# Patient Record
Sex: Female | Born: 1985
Health system: Southern US, Community
[De-identification: ages and names within clinical notes are randomized; demographics above are authoritative.]

## PROBLEM LIST (undated history)

## (undated) DIAGNOSIS — K219 Gastro-esophageal reflux disease without esophagitis: Secondary | ICD-10-CM

## (undated) HISTORY — PX: WISDOM TOOTH EXTRACTION: SHX21

---

## 2006-01-26 ENCOUNTER — Emergency Department (HOSPITAL_COMMUNITY): Admission: EM | Admit: 2006-01-26 | Discharge: 2006-01-26 | Payer: Self-pay | Admitting: Family Medicine

## 2006-04-26 ENCOUNTER — Emergency Department (HOSPITAL_COMMUNITY): Admission: EM | Admit: 2006-04-26 | Discharge: 2006-04-26 | Payer: Self-pay | Admitting: Family Medicine

## 2009-07-03 ENCOUNTER — Emergency Department (HOSPITAL_COMMUNITY): Admission: EM | Admit: 2009-07-03 | Discharge: 2009-07-03 | Payer: Self-pay | Admitting: Family Medicine

## 2009-09-18 ENCOUNTER — Emergency Department (HOSPITAL_COMMUNITY): Admission: EM | Admit: 2009-09-18 | Discharge: 2009-09-18 | Payer: Self-pay | Admitting: Family Medicine

## 2010-08-27 ENCOUNTER — Emergency Department (HOSPITAL_COMMUNITY): Admission: EM | Admit: 2010-08-27 | Discharge: 2010-08-27 | Payer: Self-pay | Admitting: Family Medicine

## 2010-12-10 ENCOUNTER — Inpatient Hospital Stay (INDEPENDENT_AMBULATORY_CARE_PROVIDER_SITE_OTHER)
Admission: RE | Admit: 2010-12-10 | Discharge: 2010-12-10 | Disposition: A | Discharge status: Home / Self Care | Payer: 59 | Source: Ambulatory Visit | Attending: Emergency Medicine | Admitting: Emergency Medicine

## 2010-12-10 DIAGNOSIS — R110 Nausea: Secondary | ICD-10-CM

## 2010-12-10 DIAGNOSIS — K209 Esophagitis, unspecified without bleeding: Secondary | ICD-10-CM

## 2010-12-10 LAB — POCT URINALYSIS DIPSTICK
Bilirubin Urine: NEGATIVE
Hgb urine dipstick: NEGATIVE
Ketones, ur: 15 mg/dL — AB
Specific Gravity, Urine: 1.025 (ref 1.005–1.030)
pH: 6 (ref 5.0–8.0)

## 2011-01-21 LAB — WET PREP, GENITAL
Trich, Wet Prep: NONE SEEN
Yeast Wet Prep HPF POC: NONE SEEN

## 2011-01-21 LAB — GC/CHLAMYDIA PROBE AMP, GENITAL

## 2011-01-21 LAB — POCT URINALYSIS DIP (DEVICE)
Bilirubin Urine: NEGATIVE
Hgb urine dipstick: NEGATIVE
Ketones, ur: NEGATIVE mg/dL
Specific Gravity, Urine: 1.02 (ref 1.005–1.030)
pH: 5.5 (ref 5.0–8.0)

## 2012-02-01 ENCOUNTER — Ambulatory Visit: Payer: 59 | Admitting: *Deleted

## 2012-02-15 ENCOUNTER — Ambulatory Visit: Payer: 59 | Admitting: *Deleted

## 2012-02-15 ENCOUNTER — Encounter: Payer: Self-pay | Admitting: *Deleted

## 2012-02-15 ENCOUNTER — Encounter: Payer: 59 | Attending: Internal Medicine | Admitting: *Deleted

## 2012-02-15 DIAGNOSIS — Z713 Dietary counseling and surveillance: Secondary | ICD-10-CM | POA: Insufficient documentation

## 2012-02-15 DIAGNOSIS — E663 Overweight: Secondary | ICD-10-CM | POA: Insufficient documentation

## 2012-02-15 NOTE — Progress Notes (Signed)
  Medical Nutrition Therapy:  Appt start time: 1130 end time:  1230.  Assessment:  Primary concerns today: food allergy, rye barley or gluten.  Patient has had allergy testing done and will go for Celiac panel in a few weeks.  Has been experiencing GI distress when eating starches, but isn't sure yet if it's Celiac disease or an allergy  MEDICATIONS: see list     DIETARY INTAKE:  Usual eating pattern includes 2 meals and 1-2 snacks per day.  Everyday foods include fruits and vegetables, lean proteins, almond milk .  Avoided foods include gluten containing foods, rye, and barley.    24-hr recall:  B ( AM): Smoothie with kale or spinach and fresh fruits, almond milk  Snk ( AM): occasionally - fresh fruit or yogurt, water  L ( PM): salad with tilapia or chicken OR hot meal with chicken or fish and vegetables, occasionally pasta (gluten food) Snk ( PM): same as AM D ( PM): Smoothie again Snk ( PM): none Beverages: water, almond milk, thai tea with almond milk  Usual physical activity: last 2 weeks, treadmill for 45 minutes 4x a week    Progress Towards Goal(s):  In progress.   Nutritional Diagnosis:  NB-1.1 Food and nutrition-related knowledge deficit As related to food allergies.  As evidenced by GI distress with starchy foods.    Intervention:  Nutrition counseling provided.  Discussed difference between food allergies and food intolerances.  Discussed gluten-free foods and alternatives to wheat flours.  Encouraged adequate protein intake and discussed adding weight training to exercise routine.  Educated client on how to identify gluten, rye, and barley containing foods via food labels.  Handouts given during visit include:  Gluten-free diet handout  Monitoring/Evaluation:  Dietary intake, exercise, reading food labels, and body weight in 4 week(s).

## 2012-02-15 NOTE — Patient Instructions (Addendum)
Plan: Will add Heather Gregory gluten-free protein powder to morning smoothie Will continue eating 3 smaller balanced meals every day Will add upper body weight training two days a week.  Bicep curls of 5 pound weights and tricep extensions (weight held behind head , keeping elbows close to body) .  8-12 repetitions 3 times of each exercise.  Will continue walking on treadmill 4-5 days a week Will try rice and oatmeal products, such as Kix, rice chex, and Rice Krispies cereals, also oatmeal and oat bran breads.  Checking the labels for gluten containing products (wheat). Use corn or rice flour for at home baking. Will try corn tortillas and taco shells- reading food labels to avoid wheat flour or rye or barley flours

## 2012-02-17 ENCOUNTER — Ambulatory Visit: Payer: 59 | Admitting: *Deleted

## 2012-03-20 ENCOUNTER — Encounter: Payer: Self-pay | Admitting: *Deleted

## 2012-03-20 ENCOUNTER — Encounter: Payer: 59 | Attending: Internal Medicine | Admitting: *Deleted

## 2012-03-20 DIAGNOSIS — E663 Overweight: Secondary | ICD-10-CM | POA: Insufficient documentation

## 2012-03-20 DIAGNOSIS — Z713 Dietary counseling and surveillance: Secondary | ICD-10-CM | POA: Insufficient documentation

## 2012-03-20 NOTE — Progress Notes (Signed)
Follow-up Medical Nutrition Therapy:  Appt start time: 1530 end time:  1600.   Assessment:  Primary concerns today: food allergies/sensitivities.   MEDICATIONS: see list   DIETARY INTAKE:  Usual eating pattern includes 3 meals and 0 snacks per day.  Everyday foods include fruit and vegetable smoothies, lean meat.  Avoided foods include all grains and dairy products    24-hr recall:  B ( AM): vegetable and egg white omelet with hashbrown; coffee and smoothie (fruits, almond milk, vegetables) Snk ( AM): none  L ( PM): smoothie like at breakfast; chicken with vegetables or fish with vegetables or salad Snk ( PM): none D ( PM): smoothie; or something similar to lunch Snk ( PM): water Beverages: water  Usual physical activity: none  Estimated energy needs: 1200 calories 135 g carbohydrates 90 g protein 33 g fat  Progress Towards Goal(s):  Some progress.   Nutritional Diagnosis:  NB-1.1 Food and nutrition-related knowledge deficit related to gluten-containing foods.  As evidenced by avoidance of all grains and some other major food products.    Intervention:  Nutrition counseling provided. Patient tested negative for Celiac disease, but still very afraid of eating certain foods.  Avoids all grain, some proteins and consumed mostly smoothies.  Discussed gluten-free grains, such as rice, buckwheat, corn, etc and encouraged her to consume more whole grains from the acceptable food list.  Also encouraged more plant-based proteins, increasing consumption of solid foods versus smoothies, and increasing physical activity.  Handouts given during visit include:  Celiac Disease handout  Goals: Aim for 1 serving of gluten-free whole grain (from list) at every meal Also have 1 fruit and 1 vegetable at every meal Eat solid food versus drinking smoothie Lean protein at every meal (eggs, nuts, beans, cheese, or animal-based) Aim for 30 minutes physical activity  Monitoring/Evaluation:   Dietary intake, exercise, and body weight prn.

## 2012-03-20 NOTE — Patient Instructions (Signed)
Aim for 1 serving of gluten-free whole grain (from list) at every meal Also have 1 fruit and 1 vegetable at every meal Eat solid food versus drinking smoothie Lean protein at every meal (eggs, nuts, beans, cheese, or animal-based) Aim for 30 minutes physical activity

## 2012-03-21 ENCOUNTER — Ambulatory Visit: Payer: 59 | Admitting: *Deleted

## 2013-07-16 ENCOUNTER — Emergency Department (HOSPITAL_COMMUNITY)
Admission: EM | Admit: 2013-07-16 | Discharge: 2013-07-16 | Disposition: A | Discharge status: Home / Self Care | Payer: 59 | Source: Home / Self Care

## 2013-07-16 ENCOUNTER — Encounter (HOSPITAL_COMMUNITY): Payer: Self-pay | Admitting: Family Medicine

## 2013-07-16 DIAGNOSIS — J069 Acute upper respiratory infection, unspecified: Secondary | ICD-10-CM

## 2013-07-16 HISTORY — DX: Gastro-esophageal reflux disease without esophagitis: K21.9

## 2013-07-16 MED ORDER — AMOXICILLIN 875 MG PO TABS: 875.0000 mg | ORAL_TABLET | Freq: Two times a day (BID) | ORAL | Status: DC

## 2013-07-16 NOTE — ED Notes (Signed)
C/o runny nose, cough and fullness in her R ear since Thursday.

## 2013-07-16 NOTE — ED Provider Notes (Signed)
CSN: [id number]     Arrival date & time 07/16/13  1919 History   None    Chief Complaint  Patient presents with  . URI   (Consider location/radiation/quality/duration/timing/severity/associated sxs/prior Treatment) HPI  Flu like symptoms: started on Thu. Flu shot on Wed. Runny nose. Tired cough and sore throat by Sun. Alka-seltser adn vicks and cough drops w/o much benefit. Subjective fevers. Loose stools off and on. Decreased PO. Denies n/v/d/c. Getting worse.   Past Medical History  Diagnosis Date  . GERD (gastroesophageal reflux disease)   . Asthma     as a child   History reviewed. No pertinent past surgical history. Family History  Problem Relation Age of Onset  . Hypertension Mother   . Stroke Mother    History  Substance Use Topics  . Smoking status: Never Smoker   . Smokeless tobacco: Not on file  . Alcohol Use: No   OB History   Grav Para Term Preterm Abortions TAB SAB Ect Mult Living                 Review of Systems  All other systems reviewed and are negative.    Allergies  Food  Home Medications   Current Outpatient Rx  Name  Route  Sig  Dispense  Refill  . amoxicillin (AMOXIL) 875 MG tablet   Oral   Take 1 tablet (875 mg total) by mouth 2 (two) times daily.   14 tablet   0   . b complex vitamins tablet   Oral   Take 1 tablet by mouth daily.         . Biotin 10 MG CAPS   Oral   Take by mouth.         . cetirizine (ZYRTEC) 5 MG tablet   Oral   Take 5 mg by mouth 2 (two) times daily.         . mometasone (NASONEX) 50 MCG/ACT nasal spray   Nasal   Place 2 sprays into the nose daily.         . Multiple Vitamins-Minerals (ECHINACEA ACZ) CAPS   Oral   Take by mouth.         . Probiotic Product (SOLUBLE FIBER/PROBIOTICS PO)   Oral   Take by mouth 2 (two) times daily.          . Vitamin D, Ergocalciferol, (DRISDOL) 50000 UNITS CAPS   Oral   Take 50,000 Units by mouth.          BP 116/82  Pulse 100  Temp(Src) 100.3  F (37.9 C) (Oral)  Resp 16  SpO2 99% Physical Exam  Constitutional: She is oriented to person, place, and time. She appears well-developed and well-nourished.  HENT:  Boggy nasal turbinates, no cervical lymphadenopathy. No pharyngeal injection or exudate.   Eyes: EOM are normal. Pupils are equal, round, and reactive to light.  Neck: Normal range of motion. Neck supple.  Cardiovascular: Normal rate and intact distal pulses.  Exam reveals friction rub.   No murmur heard. Pulmonary/Chest: Effort normal and breath sounds normal.  Abdominal: Soft. She exhibits no distension.  Musculoskeletal: Normal range of motion. She exhibits no edema.  Neurological: She is alert and oriented to person, place, and time.  Skin: Skin is warm and dry.  Psychiatric: She has a normal mood and affect. Her behavior is normal. Judgment and thought content normal.    ED Course  Procedures (including critical care time) Labs Review Labs Reviewed - No  data to display Imaging Review No results found.  MDM   1. Viral URI with cough    26 yo AAF w/ likely viral febrile illness. Day 5 of illness. Antibipate resolution in another 2-3 days - Recommended multiple OTC treatments including advil, mucinex, sudafed, saline nasal spray - ABX if symptoms significanly worsen or if lasts greater than 10 days (Amox) - precautions given and all questions answered.   Shelly Flatten, MD Family Medicine PGY-3 07/16/2013, 9:17 PM      Ozella Rocks, MD 07/16/13 2117

## 2013-07-16 NOTE — ED Provider Notes (Signed)
Medical screening examination/treatment/procedure(s) were performed by a resident physician and as supervising physician I was immediately available for consultation/collaboration.  Jozalynn Noyce, M.D.  Lisha Vitale C Nickholas Goldston, MD 07/16/13 2244 

## 2016-03-07 DIAGNOSIS — J301 Allergic rhinitis due to pollen: Secondary | ICD-10-CM | POA: Diagnosis not present

## 2016-03-07 DIAGNOSIS — I10 Essential (primary) hypertension: Secondary | ICD-10-CM | POA: Diagnosis not present

## 2016-03-08 MED FILL — levoFLOXacin 500 MG TABS: 500 | 7 days supply | Qty: 7 | Fill #0

## 2016-03-08 MED FILL — LEVOCETIRIZINE 5 MG TABLET: 5 | 30 days supply | Qty: 30 | Fill #0

## 2016-03-11 DIAGNOSIS — R7303 Prediabetes: Secondary | ICD-10-CM | POA: Diagnosis not present

## 2016-03-11 DIAGNOSIS — E559 Vitamin D deficiency, unspecified: Secondary | ICD-10-CM | POA: Diagnosis not present

## 2016-03-23 DIAGNOSIS — Z029 Encounter for administrative examinations, unspecified: Secondary | ICD-10-CM | POA: Diagnosis not present

## 2016-04-22 MED FILL — LEVOCETIRIZINE 5 MG TABLET: 5 | 30 days supply | Qty: 30 | Fill #1

## 2016-10-21 DIAGNOSIS — H5213 Myopia, bilateral: Secondary | ICD-10-CM | POA: Diagnosis not present

## 2016-11-18 DIAGNOSIS — J45909 Unspecified asthma, uncomplicated: Secondary | ICD-10-CM | POA: Diagnosis not present

## 2016-11-18 DIAGNOSIS — J309 Allergic rhinitis, unspecified: Secondary | ICD-10-CM | POA: Diagnosis not present

## 2016-11-18 DIAGNOSIS — I10 Essential (primary) hypertension: Secondary | ICD-10-CM | POA: Diagnosis not present

## 2017-06-13 MED FILL — CLINDAMYCIN HCL 150 MG CAPS: 150 | 10 days supply | Qty: 40 | Fill #0

## 2017-12-07 MED FILL — CHLORHEXIDINE 0.12% RINSE: 0.12 | 30 days supply | Qty: 473 | Fill #0

## 2017-12-07 MED FILL — ACETAMINOPHEN/COD #3 TABLET: 300-30 | 4 days supply | Qty: 16 | Fill #0

## 2018-02-09 MED FILL — FREESTYLE LANCETS: 50 days supply | Qty: 100 | Fill #0

## 2018-02-09 MED FILL — FREESTYLE LITE TEST STRIP: 50 days supply | Qty: 100 | Fill #0

## 2018-02-09 MED FILL — FREESTYLE LITE METER: 1 days supply | Qty: 1 | Fill #0

## 2018-06-12 ENCOUNTER — Ambulatory Visit (HOSPITAL_COMMUNITY)
Admission: EM | Admit: 2018-06-12 | Discharge: 2018-06-12 | Disposition: A | Discharge status: Home / Self Care | Payer: 59 | Attending: Family Medicine | Admitting: Family Medicine

## 2018-06-12 ENCOUNTER — Ambulatory Visit (INDEPENDENT_AMBULATORY_CARE_PROVIDER_SITE_OTHER): Payer: 59

## 2018-06-12 ENCOUNTER — Encounter (HOSPITAL_COMMUNITY): Payer: Self-pay

## 2018-06-12 DIAGNOSIS — W19XXXA Unspecified fall, initial encounter: Secondary | ICD-10-CM | POA: Diagnosis not present

## 2018-06-12 DIAGNOSIS — M25472 Effusion, left ankle: Secondary | ICD-10-CM

## 2018-06-12 DIAGNOSIS — R2242 Localized swelling, mass and lump, left lower limb: Secondary | ICD-10-CM | POA: Diagnosis not present

## 2018-06-12 DIAGNOSIS — S93402A Sprain of unspecified ligament of left ankle, initial encounter: Secondary | ICD-10-CM | POA: Diagnosis not present

## 2018-06-12 DIAGNOSIS — M25572 Pain in left ankle and joints of left foot: Secondary | ICD-10-CM

## 2018-06-12 NOTE — ED Notes (Signed)
Bed: UC01 Expected date:  Expected time:  Means of arrival:  Comments: Appt 

## 2018-06-12 NOTE — ED Provider Notes (Signed)
[facility]    CSN: [id number] Arrival date & time: 06/12/18  1217     History   Chief Complaint Chief Complaint  Patient presents with  . Appointment  . (12:30) Ankle Pain ;FALL    HPI Heather Gregory is a 32 y.o. female.   Patient is a 32 year old female that presents with left ankle pain and swelling.  This started after a fall that occurred today while she was walking through the parking lot to go to work.  She tripped and fell twisting the left ankle.  She was able to walk on the ankle afterwards but concerned about the swelling.  She denies any numbness, tingling in the extremity.  She denies any loss of sensation.       Past Medical History:  Diagnosis Date  . Asthma    as a child  . GERD (gastroesophageal reflux disease)     There are no active problems to display for this patient.   Past Surgical History:  Procedure Laterality Date  . WISDOM TOOTH EXTRACTION      OB History   None      Home Medications    Prior to Admission medications   Medication Sig Start Date End Date Taking? Authorizing Provider  amoxicillin (AMOXIL) 875 MG tablet Take 1 tablet (875 mg total) by mouth 2 (two) times daily. 07/16/13   Ozella Rocks, MD  b complex vitamins tablet Take 1 tablet by mouth daily.    [provider]  Biotin 10 MG CAPS Take by mouth.    [provider]  cetirizine (ZYRTEC) 5 MG tablet Take 5 mg by mouth 2 (two) times daily.    [provider]  cholecalciferol (VITAMIN D) 1000 UNITS tablet Take 5,000 Units by mouth daily.    [provider]  mometasone (NASONEX) 50 MCG/ACT nasal spray Place 2 sprays into the nose daily.    [provider]  Multiple Vitamins-Minerals (ECHINACEA ACZ) CAPS Take by mouth.    [provider]  Probiotic Product (SOLUBLE FIBER/PROBIOTICS PO) Take by mouth 2 (two) times daily.     [provider]  Pseudoeph-CPM-DM-APAP (ALKA-SELTZER PLUS-D SINUS/COLD PO)  Take by mouth.    [provider]  Vitamin D, Ergocalciferol, (DRISDOL) 50000 UNITS CAPS Take 50,000 Units by mouth.    [provider]    Family History Family History  Problem Relation Age of Onset  . Hypertension Mother   . Stroke Mother     Social History Social History   Tobacco Use  . Smoking status: Never Smoker  Substance Use Topics  . Alcohol use: No  . Drug use: No     Allergies   Food and Penicillins   Review of Systems Review of Systems  Constitutional: Positive for activity change.  Musculoskeletal: Positive for joint swelling.  Skin: Negative for color change and pallor.  Neurological: Negative for numbness.  Hematological: Does not bruise/bleed easily.  All other systems reviewed and are negative.    Physical Exam Triage Vital Signs ED Triage Vitals  Enc Vitals Group     BP 06/12/18 1234 (!) 153/109     Pulse Rate 06/12/18 1234 (!) 130     Resp 06/12/18 1234 20     Temp 06/12/18 1234 98.5 F (36.9 C)     Temp Source 06/12/18 1234 Oral     SpO2 06/12/18 1234 93 %     Weight --      Height --  Head Circumference --      Peak Flow --      Pain Score 06/12/18 1235 4     Pain Loc --      Pain Edu? --      Excl. in GC? --    No data found.  Updated Vital Signs BP (!) 153/109 (BP Location: Right Arm)   Pulse (!) 130   Temp 98.5 F (36.9 C) (Oral)   Resp 20   LMP 05/21/2018 (Exact Date)   SpO2 93%   Visual Acuity Right Eye Distance:   Left Eye Distance:   Bilateral Distance:    Right Eye Near:   Left Eye Near:    Bilateral Near:     Physical Exam  Constitutional: She is oriented to person, place, and time. She appears well-developed and well-nourished.  HENT:  Head: Normocephalic and atraumatic.  Neck: Normal range of motion.  Pulmonary/Chest: Effort normal.  Musculoskeletal: Normal range of motion. She exhibits edema and tenderness. She exhibits no deformity.  Tenderness and swelling to left medial and  lateral malleolus.  Patient able to ambulate on foot in the room.  Good range of motion.  Sensation and pulses intact  Neurological: She is alert and oriented to person, place, and time.  Skin: Skin is warm and dry.  Psychiatric: She has a normal mood and affect.  Nursing note and vitals reviewed.    UC Treatments / Results  Labs (all labs ordered are listed, but only abnormal results are displayed) Labs Reviewed - No data to display  EKG None  Radiology Dg Ankle Complete Left  Result Date: 06/12/2018 CLINICAL DATA:  Larey SeatFell 5 days ago.  Ankle region pain. EXAM: LEFT ANKLE COMPLETE - 3+ VIEW COMPARISON:  None. FINDINGS: Soft tissue swelling. No evidence of fracture or dislocation. Small joint effusion. IMPRESSION: Medial and lateral soft tissue swelling. Small joint effusion. No abnormal bone finding. Electronically Signed   By: Paulina FusiMark  Shogry M.D.   On: 06/12/2018 12:50    Procedures Procedures (including critical care time)  Medications Ordered in UC Medications - No data to display  Initial Impression / Assessment and Plan / UC Course  I have reviewed the triage vital signs and the nursing notes.  Pertinent labs & imaging results that were available during my care of the patient were reviewed by me and considered in my medical decision making (see chart for details).     X-ray negative for fracture. Patient already has ankle brace. Instructed to use the ankle brace, ice the ankle and elevate the ankle.  Ibuprofen for pain and inflammation Follow up as needed for continued or worsening symptoms  Final Clinical Impressions(s) / UC Diagnoses   Final diagnoses:  Sprain of left ankle, unspecified ligament, initial encounter     Discharge Instructions     It was nice meeting you!!  Your x ray just showed some soft tissue swelling. No fractures.  Rest, ice, elevate and use the ankle brace.  Follow up as needed for continued or worsening symptoms Ibuprofen for pain and  inflammation.      ED Prescriptions    None     Controlled Substance Prescriptions [place] Controlled Substance Registry consulted? Not Applicable   Janace ArisBast, Raheen Capili A, NP 06/12/18 1459

## 2018-06-12 NOTE — ED Triage Notes (Signed)
Pt presents with left ankle pain from a fall at work in parking lot.

## 2018-06-12 NOTE — Discharge Instructions (Addendum)
It was nice meeting you!!  Your x ray just showed some soft tissue swelling. No fractures.  Rest, ice, elevate and use the ankle brace.  Follow up as needed for continued or worsening symptoms Ibuprofen for pain and inflammation.

## 2018-08-14 MED FILL — FREESTYLE LITE TEST STRIP: 50 days supply | Qty: 100 | Fill #1

## 2018-10-19 DIAGNOSIS — R112 Nausea with vomiting, unspecified: Secondary | ICD-10-CM | POA: Diagnosis not present

## 2018-10-19 DIAGNOSIS — R739 Hyperglycemia, unspecified: Secondary | ICD-10-CM | POA: Diagnosis not present

## 2018-10-22 DIAGNOSIS — E559 Vitamin D deficiency, unspecified: Secondary | ICD-10-CM | POA: Diagnosis not present

## 2018-10-22 DIAGNOSIS — I10 Essential (primary) hypertension: Secondary | ICD-10-CM | POA: Diagnosis not present

## 2018-10-22 DIAGNOSIS — E119 Type 2 diabetes mellitus without complications: Secondary | ICD-10-CM | POA: Diagnosis not present

## 2018-11-19 MED FILL — FREESTYLE LITE TEST STRIP: 50 days supply | Qty: 100 | Fill #0 | Status: TO

## 2019-01-23 MED FILL — FREESTYLE LITE TEST STRIP: 50 days supply | Qty: 100 | Fill #0

## 2019-03-28 MED FILL — FREESTYLE LITE TEST STRIP: 50 days supply | Qty: 100 | Fill #0

## 2019-05-21 MED FILL — FREESTYLE LITE TEST STRIP: 50 days supply | Qty: 100 | Fill #1

## 2019-07-01 MED FILL — FREESTYLE LITE TEST STRIP: 50 days supply | Qty: 100 | Fill #2

## 2019-08-16 IMAGING — DX DG ANKLE COMPLETE 3+V*L*
3 series · 3 of 3 positions shown · non-contrast
Comparison: None.

CLINICAL DATA: Fell 5 days ago.  Ankle region pain.

EXAM:
LEFT ANKLE COMPLETE - 3+ VIEW

[ankle ap]
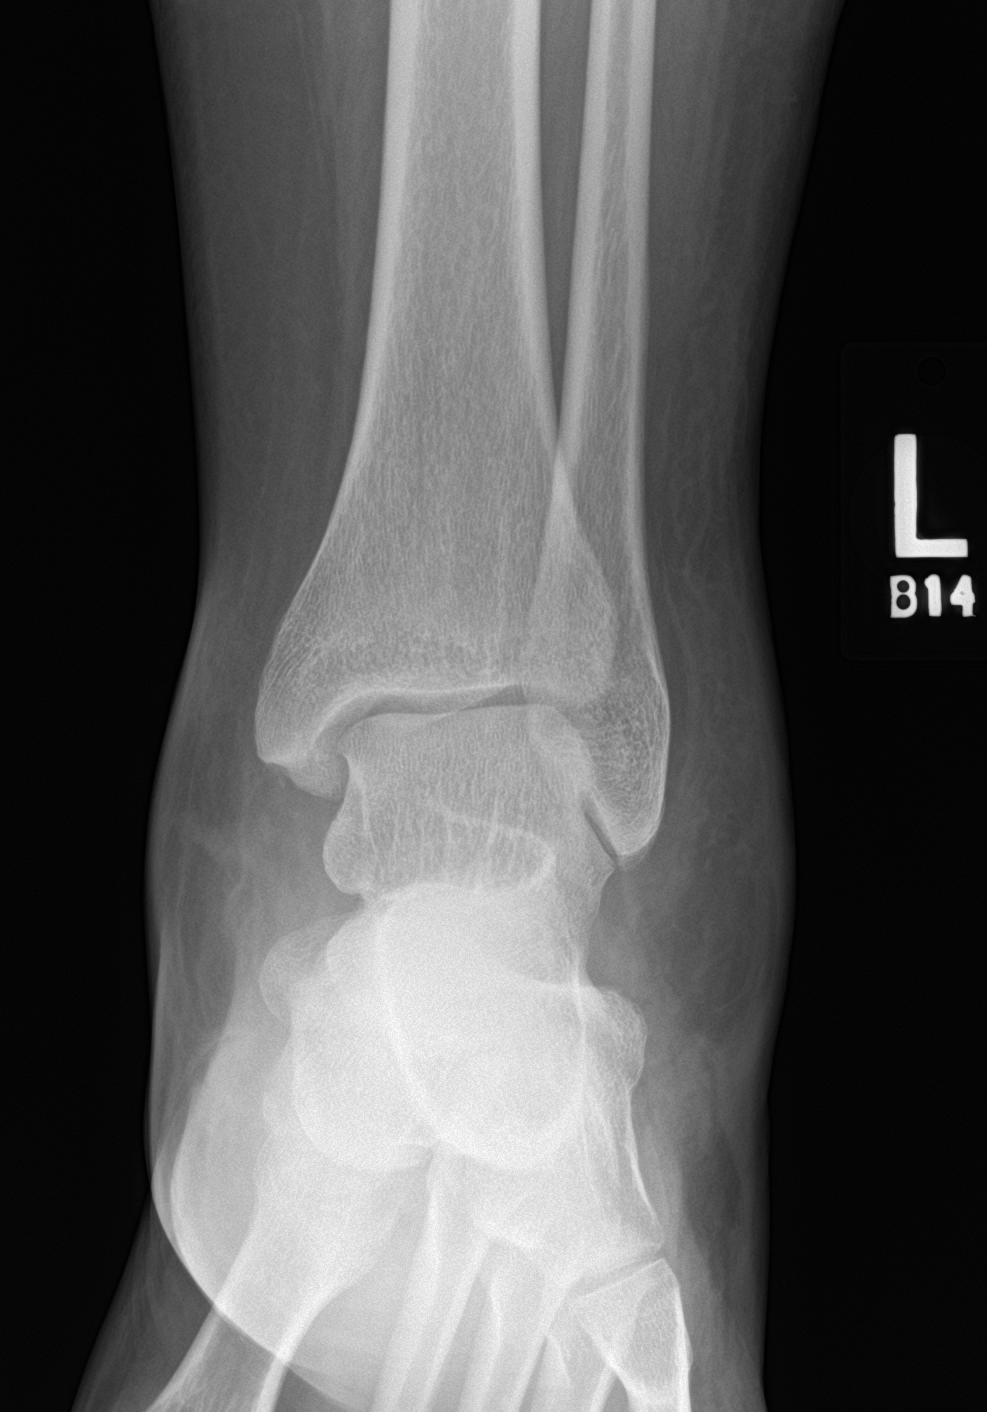

[ankle obl]
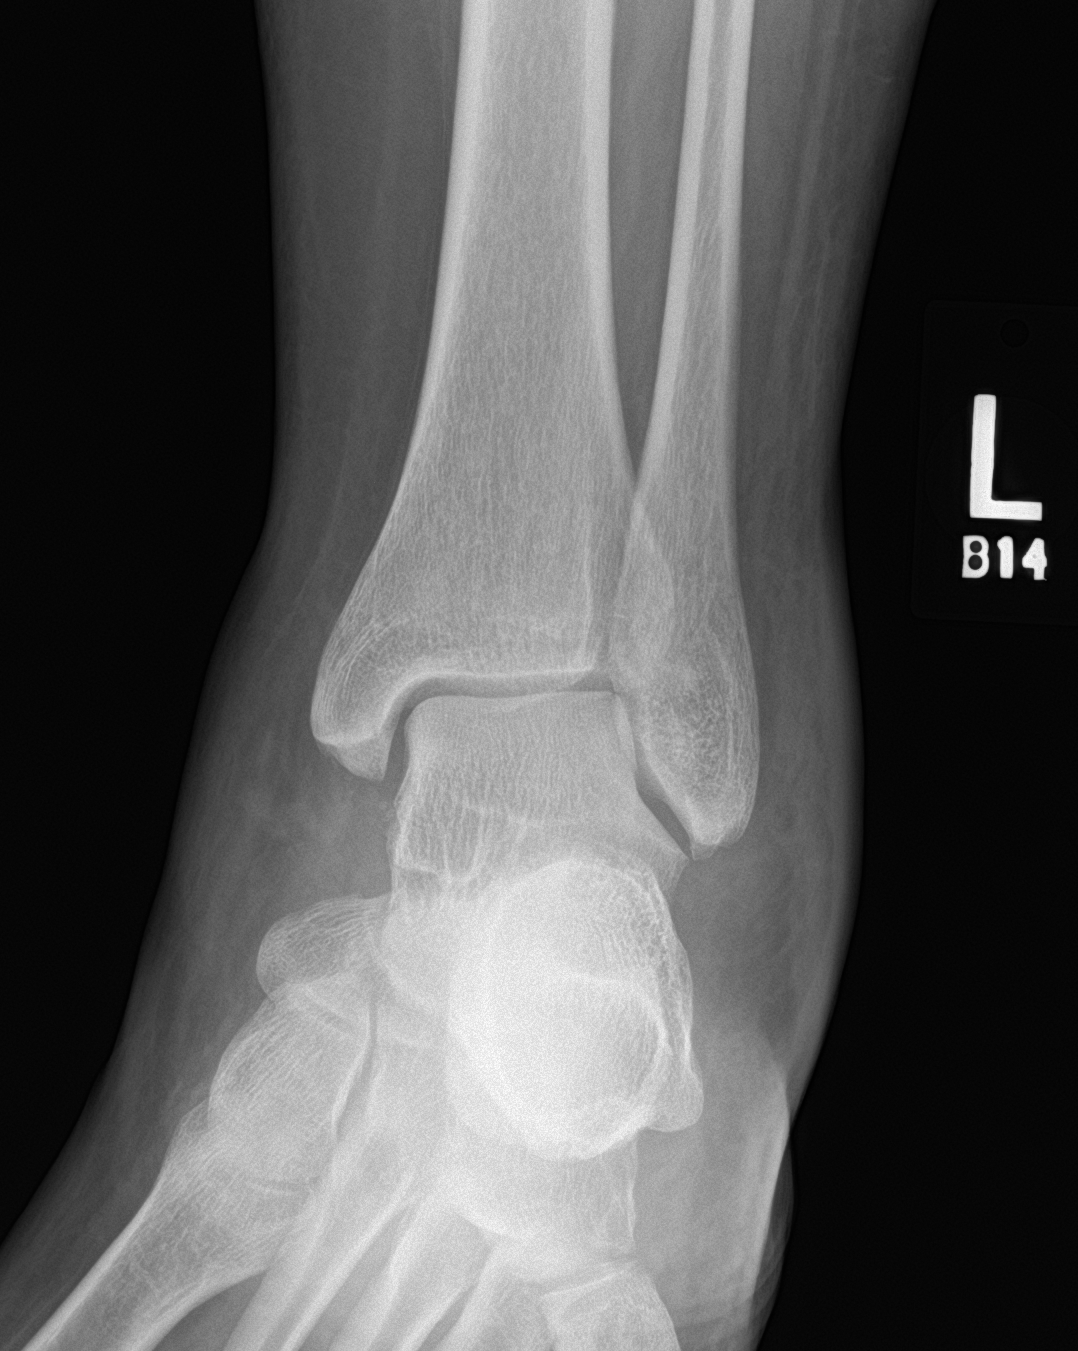

[ankle lat]
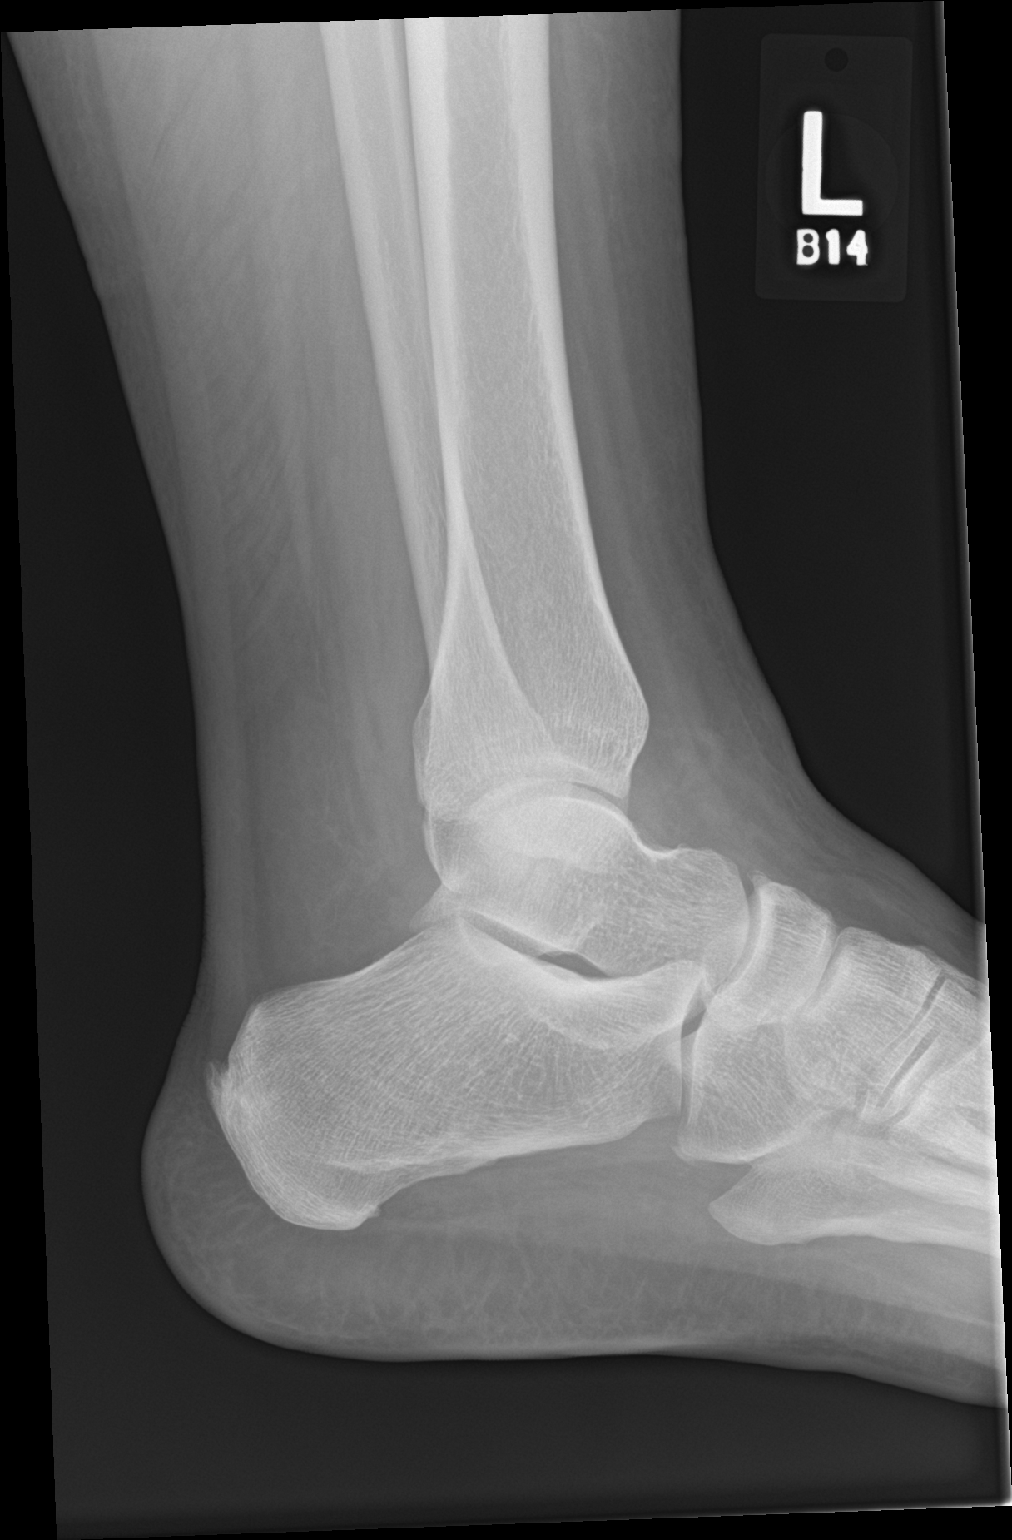

[3 of 3 positions shown; findings below may reference images not displayed]

FINDINGS: Soft tissue swelling. No evidence of fracture or dislocation. Small
joint effusion.
IMPRESSION: Medial and lateral soft tissue swelling. Small joint effusion. No
abnormal bone finding.

## 2019-08-31 DIAGNOSIS — J101 Influenza due to other identified influenza virus with other respiratory manifestations: Secondary | ICD-10-CM | POA: Diagnosis not present

## 2019-08-31 DIAGNOSIS — R509 Fever, unspecified: Secondary | ICD-10-CM | POA: Diagnosis not present

## 2019-10-08 DIAGNOSIS — H5213 Myopia, bilateral: Secondary | ICD-10-CM | POA: Diagnosis not present

## 2019-10-28 MED FILL — FREESTYLE LITE TEST STRIP: 50 days supply | Qty: 100 | Fill #0

## 2019-12-14 MED FILL — FREESTYLE LITE TEST STRIP: 50 days supply | Qty: 100 | Fill #1

## 2020-01-21 DIAGNOSIS — Z20822 Contact with and (suspected) exposure to covid-19: Secondary | ICD-10-CM | POA: Diagnosis not present

## 2020-02-15 MED FILL — FREESTYLE LITE TEST STRIP: 50 days supply | Qty: 100 | Fill #2

## 2020-05-21 MED FILL — FREESTYLE LITE TEST STRIP: 50 days supply | Qty: 100 | Fill #0

## 2020-06-09 DIAGNOSIS — R1084 Generalized abdominal pain: Secondary | ICD-10-CM | POA: Diagnosis not present

## 2020-06-09 DIAGNOSIS — R12 Heartburn: Secondary | ICD-10-CM | POA: Diagnosis not present

## 2020-06-09 DIAGNOSIS — Z713 Dietary counseling and surveillance: Secondary | ICD-10-CM | POA: Diagnosis not present

## 2020-07-02 DIAGNOSIS — R05 Cough: Secondary | ICD-10-CM | POA: Diagnosis not present

## 2020-07-02 DIAGNOSIS — B349 Viral infection, unspecified: Secondary | ICD-10-CM | POA: Diagnosis not present

## 2020-07-02 DIAGNOSIS — Z20822 Contact with and (suspected) exposure to covid-19: Secondary | ICD-10-CM | POA: Diagnosis not present

## 2020-07-15 MED FILL — FREESTYLE LITE TEST STRIP: 50 days supply | Qty: 100 | Fill #0

## 2020-08-04 DIAGNOSIS — R1084 Generalized abdominal pain: Secondary | ICD-10-CM | POA: Diagnosis not present

## 2020-08-04 DIAGNOSIS — R12 Heartburn: Secondary | ICD-10-CM | POA: Diagnosis not present

## 2020-08-04 DIAGNOSIS — Z713 Dietary counseling and surveillance: Secondary | ICD-10-CM | POA: Diagnosis not present

## 2020-08-18 DIAGNOSIS — R1084 Generalized abdominal pain: Secondary | ICD-10-CM | POA: Diagnosis not present

## 2020-08-18 DIAGNOSIS — R12 Heartburn: Secondary | ICD-10-CM | POA: Diagnosis not present

## 2020-08-18 DIAGNOSIS — Z713 Dietary counseling and surveillance: Secondary | ICD-10-CM | POA: Diagnosis not present

## 2020-09-11 MED FILL — FREESTYLE LITE TEST STRIP: 50 days supply | Qty: 100 | Fill #0

## 2020-10-08 DIAGNOSIS — H5213 Myopia, bilateral: Secondary | ICD-10-CM | POA: Diagnosis not present

## 2020-11-09 DIAGNOSIS — Z9189 Other specified personal risk factors, not elsewhere classified: Secondary | ICD-10-CM | POA: Diagnosis not present

## 2020-11-09 DIAGNOSIS — Z20822 Contact with and (suspected) exposure to covid-19: Secondary | ICD-10-CM | POA: Diagnosis not present

## 2020-12-01 ENCOUNTER — Other Ambulatory Visit (HOSPITAL_COMMUNITY): Payer: Self-pay | Admitting: Internal Medicine

## 2021-05-03 ENCOUNTER — Other Ambulatory Visit (HOSPITAL_COMMUNITY): Payer: Self-pay

## 2021-05-03 MED FILL — Glucose Blood Test Strip: 50 days supply | Qty: 100 | Fill #0 | Status: AC

## 2021-08-28 ENCOUNTER — Other Ambulatory Visit (HOSPITAL_COMMUNITY): Payer: Self-pay | Admitting: Internal Medicine

## 2021-08-28 ENCOUNTER — Other Ambulatory Visit (HOSPITAL_COMMUNITY): Payer: Self-pay

## 2021-08-30 ENCOUNTER — Other Ambulatory Visit (HOSPITAL_COMMUNITY): Payer: Self-pay

## 2022-09-01 DIAGNOSIS — R1011 Right upper quadrant pain: Secondary | ICD-10-CM | POA: Diagnosis not present

## 2022-09-01 DIAGNOSIS — E559 Vitamin D deficiency, unspecified: Secondary | ICD-10-CM | POA: Diagnosis not present

## 2022-09-01 DIAGNOSIS — K219 Gastro-esophageal reflux disease without esophagitis: Secondary | ICD-10-CM | POA: Diagnosis not present

## 2022-09-01 DIAGNOSIS — Z13220 Encounter for screening for lipoid disorders: Secondary | ICD-10-CM | POA: Diagnosis not present

## 2022-09-01 DIAGNOSIS — K9049 Malabsorption due to intolerance, not elsewhere classified: Secondary | ICD-10-CM | POA: Diagnosis not present

## 2022-09-01 DIAGNOSIS — E1169 Type 2 diabetes mellitus with other specified complication: Secondary | ICD-10-CM | POA: Diagnosis not present

## 2022-09-01 DIAGNOSIS — Z1331 Encounter for screening for depression: Secondary | ICD-10-CM | POA: Diagnosis not present

## 2022-09-01 DIAGNOSIS — K909 Intestinal malabsorption, unspecified: Secondary | ICD-10-CM | POA: Diagnosis not present

## 2022-09-01 DIAGNOSIS — E8889 Other specified metabolic disorders: Secondary | ICD-10-CM | POA: Diagnosis not present

## 2022-09-01 DIAGNOSIS — U099 Post covid-19 condition, unspecified: Secondary | ICD-10-CM | POA: Diagnosis not present

## 2022-09-05 ENCOUNTER — Other Ambulatory Visit (HOSPITAL_COMMUNITY): Payer: Self-pay

## 2022-09-05 MED ORDER — MOUNJARO 2.5 MG/0.5ML ~~LOC~~ SOAJ
2.5000 mg | SUBCUTANEOUS | 0 refills | Status: DC
  Filled 2022-09-05: qty 2, 28d supply, fill #0

## 2022-09-06 ENCOUNTER — Other Ambulatory Visit (HOSPITAL_COMMUNITY): Payer: Self-pay

## 2022-09-07 ENCOUNTER — Other Ambulatory Visit (HOSPITAL_COMMUNITY): Payer: Self-pay

## 2022-09-15 ENCOUNTER — Other Ambulatory Visit (HOSPITAL_COMMUNITY): Payer: Self-pay

## 2022-09-15 DIAGNOSIS — K219 Gastro-esophageal reflux disease without esophagitis: Secondary | ICD-10-CM | POA: Diagnosis not present

## 2022-09-15 DIAGNOSIS — E1169 Type 2 diabetes mellitus with other specified complication: Secondary | ICD-10-CM | POA: Diagnosis not present

## 2022-09-15 DIAGNOSIS — Z6841 Body Mass Index (BMI) 40.0 and over, adult: Secondary | ICD-10-CM | POA: Diagnosis not present

## 2022-09-15 MED ORDER — DEXCOM G7 SENSOR MISC
0 refills | Status: DC
  Filled 2022-09-15: qty 3, 30d supply, fill #0

## 2022-09-22 ENCOUNTER — Other Ambulatory Visit (HOSPITAL_COMMUNITY): Payer: Self-pay

## 2022-09-26 ENCOUNTER — Other Ambulatory Visit (HOSPITAL_COMMUNITY): Payer: Self-pay

## 2022-09-27 ENCOUNTER — Other Ambulatory Visit (HOSPITAL_COMMUNITY): Payer: Self-pay

## 2022-09-28 ENCOUNTER — Other Ambulatory Visit (HOSPITAL_COMMUNITY): Payer: Self-pay

## 2022-09-29 DIAGNOSIS — K5903 Drug induced constipation: Secondary | ICD-10-CM | POA: Diagnosis not present

## 2022-09-29 DIAGNOSIS — K909 Intestinal malabsorption, unspecified: Secondary | ICD-10-CM | POA: Diagnosis not present

## 2022-09-29 DIAGNOSIS — E1169 Type 2 diabetes mellitus with other specified complication: Secondary | ICD-10-CM | POA: Diagnosis not present

## 2022-09-29 DIAGNOSIS — Z6841 Body Mass Index (BMI) 40.0 and over, adult: Secondary | ICD-10-CM | POA: Diagnosis not present

## 2022-09-30 ENCOUNTER — Other Ambulatory Visit: Payer: Self-pay

## 2022-09-30 ENCOUNTER — Other Ambulatory Visit (HOSPITAL_COMMUNITY): Payer: Self-pay

## 2022-09-30 MED ORDER — MOUNJARO 5 MG/0.5ML ~~LOC~~ SOAJ
5.0000 mg | SUBCUTANEOUS | 1 refills | Status: DC
  Filled 2022-09-30: qty 2, 28d supply, fill #0
  Filled 2022-10-20 – 2022-10-24 (×2): qty 2, 28d supply, fill #1
  Filled 2022-11-29: qty 2, 28d supply, fill #2
  Filled 2022-12-23: qty 2, 28d supply, fill #3
  Filled 2023-01-18 – 2023-08-25 (×6): qty 2, 28d supply, fill #4

## 2022-09-30 MED ORDER — DEXCOM G7 SENSOR MISC
1 refills | Status: DC
  Filled 2022-09-30 – 2022-10-20 (×2): qty 9, 90d supply, fill #0
  Filled 2023-01-07 (×2): qty 9, 90d supply, fill #1

## 2022-10-01 ENCOUNTER — Other Ambulatory Visit (HOSPITAL_COMMUNITY): Payer: Self-pay

## 2022-10-07 DIAGNOSIS — H5213 Myopia, bilateral: Secondary | ICD-10-CM | POA: Diagnosis not present

## 2022-10-20 ENCOUNTER — Other Ambulatory Visit (HOSPITAL_COMMUNITY): Payer: Self-pay

## 2022-10-21 ENCOUNTER — Other Ambulatory Visit (HOSPITAL_COMMUNITY): Payer: Self-pay

## 2022-11-01 DIAGNOSIS — E65 Localized adiposity: Secondary | ICD-10-CM | POA: Diagnosis not present

## 2022-11-01 DIAGNOSIS — Z6841 Body Mass Index (BMI) 40.0 and over, adult: Secondary | ICD-10-CM | POA: Diagnosis not present

## 2022-11-01 DIAGNOSIS — E1169 Type 2 diabetes mellitus with other specified complication: Secondary | ICD-10-CM | POA: Diagnosis not present

## 2022-11-01 DIAGNOSIS — K5903 Drug induced constipation: Secondary | ICD-10-CM | POA: Diagnosis not present

## 2022-11-16 DIAGNOSIS — Z6841 Body Mass Index (BMI) 40.0 and over, adult: Secondary | ICD-10-CM | POA: Diagnosis not present

## 2022-11-16 DIAGNOSIS — K5903 Drug induced constipation: Secondary | ICD-10-CM | POA: Diagnosis not present

## 2022-11-16 DIAGNOSIS — E65 Localized adiposity: Secondary | ICD-10-CM | POA: Diagnosis not present

## 2022-11-16 DIAGNOSIS — G4709 Other insomnia: Secondary | ICD-10-CM | POA: Diagnosis not present

## 2022-11-16 DIAGNOSIS — E1169 Type 2 diabetes mellitus with other specified complication: Secondary | ICD-10-CM | POA: Diagnosis not present

## 2022-11-29 ENCOUNTER — Other Ambulatory Visit (HOSPITAL_COMMUNITY): Payer: Self-pay

## 2023-01-04 DIAGNOSIS — Z6841 Body Mass Index (BMI) 40.0 and over, adult: Secondary | ICD-10-CM | POA: Diagnosis not present

## 2023-01-04 DIAGNOSIS — K5903 Drug induced constipation: Secondary | ICD-10-CM | POA: Diagnosis not present

## 2023-01-04 DIAGNOSIS — D508 Other iron deficiency anemias: Secondary | ICD-10-CM | POA: Diagnosis not present

## 2023-01-04 DIAGNOSIS — E1169 Type 2 diabetes mellitus with other specified complication: Secondary | ICD-10-CM | POA: Diagnosis not present

## 2023-01-05 ENCOUNTER — Other Ambulatory Visit (HOSPITAL_COMMUNITY): Payer: Self-pay

## 2023-01-05 MED ORDER — MOUNJARO 7.5 MG/0.5ML ~~LOC~~ SOAJ
7.5000 mg | SUBCUTANEOUS | 0 refills | Status: DC
  Filled 2023-01-05: qty 6, 90d supply, fill #0
  Filled 2023-01-27: qty 2, 28d supply, fill #0
  Filled 2023-02-21: qty 2, 28d supply, fill #1
  Filled 2023-03-17: qty 2, 28d supply, fill #2

## 2023-01-05 MED ORDER — BLOOD GLUCOSE TEST VI STRP
ORAL_STRIP | 11 refills | Status: DC
  Filled 2023-01-05: qty 100, 50d supply, fill #0

## 2023-01-07 ENCOUNTER — Other Ambulatory Visit (HOSPITAL_COMMUNITY): Payer: Self-pay

## 2023-01-08 ENCOUNTER — Other Ambulatory Visit: Payer: Self-pay

## 2023-01-11 ENCOUNTER — Other Ambulatory Visit (HOSPITAL_COMMUNITY): Payer: Self-pay

## 2023-01-18 ENCOUNTER — Other Ambulatory Visit (HOSPITAL_COMMUNITY): Payer: Self-pay

## 2023-01-19 ENCOUNTER — Other Ambulatory Visit (HOSPITAL_COMMUNITY): Payer: Self-pay

## 2023-01-27 ENCOUNTER — Other Ambulatory Visit (HOSPITAL_COMMUNITY): Payer: Self-pay

## 2023-02-21 ENCOUNTER — Other Ambulatory Visit (HOSPITAL_COMMUNITY): Payer: Self-pay

## 2023-02-27 DIAGNOSIS — Z6841 Body Mass Index (BMI) 40.0 and over, adult: Secondary | ICD-10-CM | POA: Diagnosis not present

## 2023-02-27 DIAGNOSIS — R140 Abdominal distension (gaseous): Secondary | ICD-10-CM | POA: Diagnosis not present

## 2023-02-27 DIAGNOSIS — E1169 Type 2 diabetes mellitus with other specified complication: Secondary | ICD-10-CM | POA: Diagnosis not present

## 2023-02-27 DIAGNOSIS — F439 Reaction to severe stress, unspecified: Secondary | ICD-10-CM | POA: Diagnosis not present

## 2023-03-27 DIAGNOSIS — Z6841 Body Mass Index (BMI) 40.0 and over, adult: Secondary | ICD-10-CM | POA: Diagnosis not present

## 2023-03-27 DIAGNOSIS — K5903 Drug induced constipation: Secondary | ICD-10-CM | POA: Diagnosis not present

## 2023-03-27 DIAGNOSIS — E1169 Type 2 diabetes mellitus with other specified complication: Secondary | ICD-10-CM | POA: Diagnosis not present

## 2023-03-27 DIAGNOSIS — Z3141 Encounter for fertility testing: Secondary | ICD-10-CM | POA: Diagnosis not present

## 2023-03-27 DIAGNOSIS — N925 Other specified irregular menstruation: Secondary | ICD-10-CM | POA: Diagnosis not present

## 2023-04-15 ENCOUNTER — Other Ambulatory Visit (HOSPITAL_COMMUNITY): Payer: Self-pay

## 2023-04-17 ENCOUNTER — Other Ambulatory Visit (HOSPITAL_COMMUNITY): Payer: Self-pay

## 2023-04-17 MED ORDER — MOUNJARO 7.5 MG/0.5ML ~~LOC~~ SOAJ
SUBCUTANEOUS | 0 refills | Status: DC
  Filled 2023-04-17: qty 2, 28d supply, fill #0
  Filled 2023-05-13: qty 2, 28d supply, fill #1
  Filled 2023-06-16: qty 2, 28d supply, fill #2

## 2023-04-18 ENCOUNTER — Other Ambulatory Visit (HOSPITAL_COMMUNITY): Payer: Self-pay

## 2023-04-18 MED ORDER — DEXCOM G7 SENSOR MISC
3 refills | Status: DC
  Filled 2023-04-18 (×2): qty 9, 90d supply, fill #0
  Filled 2023-07-22: qty 9, 90d supply, fill #1
  Filled 2023-11-06: qty 9, 90d supply, fill #2
  Filled 2024-02-10 – 2024-02-14 (×2): qty 3, 30d supply, fill #3
  Filled 2024-03-18: qty 6, 60d supply, fill #4

## 2023-04-21 ENCOUNTER — Other Ambulatory Visit (HOSPITAL_COMMUNITY): Payer: Self-pay

## 2023-04-24 DIAGNOSIS — Z6841 Body Mass Index (BMI) 40.0 and over, adult: Secondary | ICD-10-CM | POA: Diagnosis not present

## 2023-04-24 DIAGNOSIS — R142 Eructation: Secondary | ICD-10-CM | POA: Diagnosis not present

## 2023-04-24 DIAGNOSIS — E1169 Type 2 diabetes mellitus with other specified complication: Secondary | ICD-10-CM | POA: Diagnosis not present

## 2023-04-24 DIAGNOSIS — F418 Other specified anxiety disorders: Secondary | ICD-10-CM | POA: Diagnosis not present

## 2023-05-15 ENCOUNTER — Other Ambulatory Visit (HOSPITAL_COMMUNITY): Payer: Self-pay

## 2023-05-20 ENCOUNTER — Other Ambulatory Visit (HOSPITAL_COMMUNITY): Payer: Self-pay

## 2023-05-22 DIAGNOSIS — E1169 Type 2 diabetes mellitus with other specified complication: Secondary | ICD-10-CM | POA: Diagnosis not present

## 2023-05-22 DIAGNOSIS — K5903 Drug induced constipation: Secondary | ICD-10-CM | POA: Diagnosis not present

## 2023-05-22 DIAGNOSIS — E782 Mixed hyperlipidemia: Secondary | ICD-10-CM | POA: Diagnosis not present

## 2023-05-22 DIAGNOSIS — Z6841 Body Mass Index (BMI) 40.0 and over, adult: Secondary | ICD-10-CM | POA: Diagnosis not present

## 2023-05-24 ENCOUNTER — Other Ambulatory Visit (HOSPITAL_COMMUNITY): Payer: Self-pay

## 2023-05-24 MED ORDER — LINZESS 145 MCG PO CAPS
145.0000 ug | ORAL_CAPSULE | Freq: Every day | ORAL | 1 refills | Status: DC
  Filled 2023-05-24 – 2023-06-16 (×2): qty 30, 30d supply, fill #0

## 2023-05-24 MED ORDER — MOUNJARO 5 MG/0.5ML ~~LOC~~ SOAJ
5.0000 mg | SUBCUTANEOUS | 1 refills | Status: DC
  Filled 2023-05-24 – 2023-06-26 (×3): qty 2, 28d supply, fill #0
  Filled 2023-07-25: qty 2, 28d supply, fill #1

## 2023-06-01 ENCOUNTER — Other Ambulatory Visit (HOSPITAL_COMMUNITY): Payer: Self-pay

## 2023-06-05 DIAGNOSIS — E1169 Type 2 diabetes mellitus with other specified complication: Secondary | ICD-10-CM | POA: Diagnosis not present

## 2023-06-05 DIAGNOSIS — E782 Mixed hyperlipidemia: Secondary | ICD-10-CM | POA: Diagnosis not present

## 2023-06-05 DIAGNOSIS — Z6841 Body Mass Index (BMI) 40.0 and over, adult: Secondary | ICD-10-CM | POA: Diagnosis not present

## 2023-06-05 DIAGNOSIS — R7982 Elevated C-reactive protein (CRP): Secondary | ICD-10-CM | POA: Diagnosis not present

## 2023-06-05 DIAGNOSIS — R7989 Other specified abnormal findings of blood chemistry: Secondary | ICD-10-CM | POA: Diagnosis not present

## 2023-06-05 DIAGNOSIS — R5383 Other fatigue: Secondary | ICD-10-CM | POA: Diagnosis not present

## 2023-06-16 ENCOUNTER — Other Ambulatory Visit (HOSPITAL_COMMUNITY): Payer: Self-pay

## 2023-06-26 ENCOUNTER — Other Ambulatory Visit (HOSPITAL_COMMUNITY): Payer: Self-pay

## 2023-06-26 DIAGNOSIS — R5383 Other fatigue: Secondary | ICD-10-CM | POA: Diagnosis not present

## 2023-06-26 DIAGNOSIS — F418 Other specified anxiety disorders: Secondary | ICD-10-CM | POA: Diagnosis not present

## 2023-06-26 DIAGNOSIS — K5903 Drug induced constipation: Secondary | ICD-10-CM | POA: Diagnosis not present

## 2023-06-26 DIAGNOSIS — E782 Mixed hyperlipidemia: Secondary | ICD-10-CM | POA: Diagnosis not present

## 2023-06-26 DIAGNOSIS — Z6839 Body mass index (BMI) 39.0-39.9, adult: Secondary | ICD-10-CM | POA: Diagnosis not present

## 2023-06-26 DIAGNOSIS — E1169 Type 2 diabetes mellitus with other specified complication: Secondary | ICD-10-CM | POA: Diagnosis not present

## 2023-06-26 MED ORDER — MOUNJARO 5 MG/0.5ML ~~LOC~~ SOAJ
5.0000 mg | SUBCUTANEOUS | 1 refills | Status: DC
  Filled 2023-06-26 – 2023-08-25 (×5): qty 2, 28d supply, fill #0
  Filled 2023-09-30: qty 2, 28d supply, fill #1

## 2023-06-28 ENCOUNTER — Other Ambulatory Visit (HOSPITAL_COMMUNITY): Payer: Self-pay

## 2023-07-22 ENCOUNTER — Other Ambulatory Visit (HOSPITAL_COMMUNITY): Payer: Self-pay

## 2023-07-24 ENCOUNTER — Other Ambulatory Visit: Payer: Self-pay

## 2023-07-24 ENCOUNTER — Other Ambulatory Visit (HOSPITAL_COMMUNITY): Payer: Self-pay

## 2023-07-24 MED ORDER — AZITHROMYCIN 250 MG PO TABS
ORAL_TABLET | ORAL | 0 refills | Status: DC
  Filled 2023-07-24: qty 6, 5d supply, fill #0

## 2023-07-25 ENCOUNTER — Other Ambulatory Visit (HOSPITAL_COMMUNITY): Payer: Self-pay

## 2023-07-28 ENCOUNTER — Other Ambulatory Visit (HOSPITAL_COMMUNITY): Payer: Self-pay

## 2023-08-25 ENCOUNTER — Other Ambulatory Visit (HOSPITAL_COMMUNITY): Payer: Self-pay

## 2023-08-28 ENCOUNTER — Other Ambulatory Visit (HOSPITAL_COMMUNITY): Payer: Self-pay

## 2023-08-30 ENCOUNTER — Other Ambulatory Visit (HOSPITAL_COMMUNITY): Payer: Self-pay

## 2023-09-08 ENCOUNTER — Other Ambulatory Visit (HOSPITAL_COMMUNITY): Payer: Self-pay

## 2023-10-16 ENCOUNTER — Other Ambulatory Visit (HOSPITAL_BASED_OUTPATIENT_CLINIC_OR_DEPARTMENT_OTHER): Payer: Self-pay

## 2023-10-16 ENCOUNTER — Other Ambulatory Visit (HOSPITAL_COMMUNITY): Payer: Self-pay

## 2023-10-16 DIAGNOSIS — E66813 Obesity, class 3: Secondary | ICD-10-CM | POA: Diagnosis not present

## 2023-10-16 DIAGNOSIS — L282 Other prurigo: Secondary | ICD-10-CM | POA: Diagnosis not present

## 2023-10-16 DIAGNOSIS — E1169 Type 2 diabetes mellitus with other specified complication: Secondary | ICD-10-CM | POA: Diagnosis not present

## 2023-10-16 DIAGNOSIS — Z91018 Allergy to other foods: Secondary | ICD-10-CM | POA: Diagnosis not present

## 2023-10-16 DIAGNOSIS — Z6841 Body Mass Index (BMI) 40.0 and over, adult: Secondary | ICD-10-CM | POA: Diagnosis not present

## 2023-10-16 MED ORDER — TRIAMCINOLONE ACETONIDE 0.1 % EX OINT
TOPICAL_OINTMENT | CUTANEOUS | 3 refills | Status: AC
  Filled 2023-10-16: qty 454, 90d supply, fill #0

## 2023-10-20 ENCOUNTER — Other Ambulatory Visit (HOSPITAL_COMMUNITY): Payer: Self-pay

## 2023-11-01 ENCOUNTER — Other Ambulatory Visit (HOSPITAL_COMMUNITY): Payer: Self-pay

## 2023-11-02 ENCOUNTER — Other Ambulatory Visit (HOSPITAL_COMMUNITY): Payer: Self-pay

## 2023-11-02 MED ORDER — MOUNJARO 5 MG/0.5ML ~~LOC~~ SOAJ
5.0000 mg | SUBCUTANEOUS | 1 refills | Status: DC
  Filled 2023-11-02: qty 2, 28d supply, fill #0
  Filled 2023-11-29: qty 2, 28d supply, fill #1

## 2023-11-04 ENCOUNTER — Other Ambulatory Visit (HOSPITAL_COMMUNITY): Payer: Self-pay

## 2023-11-06 ENCOUNTER — Other Ambulatory Visit (HOSPITAL_COMMUNITY): Payer: Self-pay

## 2023-11-15 ENCOUNTER — Other Ambulatory Visit: Payer: Self-pay

## 2023-12-01 ENCOUNTER — Other Ambulatory Visit (HOSPITAL_COMMUNITY): Payer: Self-pay

## 2023-12-21 ENCOUNTER — Other Ambulatory Visit (HOSPITAL_COMMUNITY): Payer: Self-pay

## 2023-12-21 MED ORDER — MOUNJARO 5 MG/0.5ML ~~LOC~~ SOAJ
5.0000 mg | SUBCUTANEOUS | 1 refills | Status: DC
  Filled 2023-12-21 – 2023-12-22 (×2): qty 6, 84d supply, fill #0
  Filled 2024-03-18: qty 6, 84d supply, fill #1

## 2023-12-22 ENCOUNTER — Other Ambulatory Visit (HOSPITAL_COMMUNITY): Payer: Self-pay

## 2023-12-22 ENCOUNTER — Other Ambulatory Visit: Payer: Self-pay

## 2023-12-30 ENCOUNTER — Other Ambulatory Visit (HOSPITAL_COMMUNITY): Payer: Self-pay

## 2024-01-01 ENCOUNTER — Other Ambulatory Visit (HOSPITAL_COMMUNITY): Payer: Self-pay

## 2024-01-03 DIAGNOSIS — K638219 Small intestinal bacterial overgrowth, unspecified: Secondary | ICD-10-CM | POA: Diagnosis not present

## 2024-01-03 DIAGNOSIS — E785 Hyperlipidemia, unspecified: Secondary | ICD-10-CM | POA: Diagnosis not present

## 2024-01-03 DIAGNOSIS — E119 Type 2 diabetes mellitus without complications: Secondary | ICD-10-CM | POA: Diagnosis not present

## 2024-01-03 DIAGNOSIS — I10 Essential (primary) hypertension: Secondary | ICD-10-CM | POA: Diagnosis not present

## 2024-01-15 DIAGNOSIS — E1169 Type 2 diabetes mellitus with other specified complication: Secondary | ICD-10-CM | POA: Diagnosis not present

## 2024-01-15 DIAGNOSIS — Z6841 Body Mass Index (BMI) 40.0 and over, adult: Secondary | ICD-10-CM | POA: Diagnosis not present

## 2024-01-15 DIAGNOSIS — E7800 Pure hypercholesterolemia, unspecified: Secondary | ICD-10-CM | POA: Diagnosis not present

## 2024-01-15 DIAGNOSIS — E66813 Obesity, class 3: Secondary | ICD-10-CM | POA: Diagnosis not present

## 2024-02-10 ENCOUNTER — Other Ambulatory Visit (HOSPITAL_COMMUNITY): Payer: Self-pay

## 2024-02-12 ENCOUNTER — Other Ambulatory Visit (HOSPITAL_COMMUNITY): Payer: Self-pay

## 2024-02-13 ENCOUNTER — Other Ambulatory Visit (HOSPITAL_COMMUNITY): Payer: Self-pay

## 2024-02-14 ENCOUNTER — Other Ambulatory Visit (HOSPITAL_COMMUNITY): Payer: Self-pay

## 2024-03-18 ENCOUNTER — Other Ambulatory Visit (HOSPITAL_COMMUNITY): Payer: Self-pay

## 2024-03-19 ENCOUNTER — Other Ambulatory Visit (HOSPITAL_COMMUNITY): Payer: Self-pay

## 2024-03-21 ENCOUNTER — Other Ambulatory Visit (HOSPITAL_COMMUNITY): Payer: Self-pay

## 2024-03-21 DIAGNOSIS — Z8619 Personal history of other infectious and parasitic diseases: Secondary | ICD-10-CM | POA: Diagnosis not present

## 2024-03-21 DIAGNOSIS — R198 Other specified symptoms and signs involving the digestive system and abdomen: Secondary | ICD-10-CM | POA: Diagnosis not present

## 2024-03-21 DIAGNOSIS — K9049 Malabsorption due to intolerance, not elsewhere classified: Secondary | ICD-10-CM | POA: Diagnosis not present

## 2024-03-21 DIAGNOSIS — E66812 Obesity, class 2: Secondary | ICD-10-CM | POA: Diagnosis not present

## 2024-03-21 DIAGNOSIS — E1169 Type 2 diabetes mellitus with other specified complication: Secondary | ICD-10-CM | POA: Diagnosis not present

## 2024-03-21 DIAGNOSIS — Z6839 Body mass index (BMI) 39.0-39.9, adult: Secondary | ICD-10-CM | POA: Diagnosis not present

## 2024-03-21 MED ORDER — DEXCOM G7 SENSOR MISC
1.0000 | 1 refills | Status: DC
  Filled 2024-03-21 – 2024-05-17 (×4): qty 3, 30d supply, fill #0

## 2024-03-21 MED ORDER — MOUNJARO 2.5 MG/0.5ML ~~LOC~~ SOAJ
2.5000 mg | SUBCUTANEOUS | 0 refills | Status: DC
  Filled 2024-03-21 – 2024-03-23 (×3): qty 6, 84d supply, fill #0
  Filled 2024-03-30 – 2024-06-22 (×2): qty 2, 28d supply, fill #0

## 2024-03-22 ENCOUNTER — Other Ambulatory Visit (HOSPITAL_COMMUNITY): Payer: Self-pay

## 2024-03-23 ENCOUNTER — Other Ambulatory Visit (HOSPITAL_COMMUNITY): Payer: Self-pay

## 2024-03-28 ENCOUNTER — Other Ambulatory Visit (HOSPITAL_COMMUNITY): Payer: Self-pay

## 2024-03-30 ENCOUNTER — Other Ambulatory Visit (HOSPITAL_COMMUNITY): Payer: Self-pay

## 2024-04-09 DIAGNOSIS — H5213 Myopia, bilateral: Secondary | ICD-10-CM | POA: Diagnosis not present

## 2024-04-11 DIAGNOSIS — R1013 Epigastric pain: Secondary | ICD-10-CM | POA: Diagnosis not present

## 2024-04-11 DIAGNOSIS — Z6839 Body mass index (BMI) 39.0-39.9, adult: Secondary | ICD-10-CM | POA: Diagnosis not present

## 2024-04-11 DIAGNOSIS — Z91018 Allergy to other foods: Secondary | ICD-10-CM | POA: Diagnosis not present

## 2024-04-11 DIAGNOSIS — R198 Other specified symptoms and signs involving the digestive system and abdomen: Secondary | ICD-10-CM | POA: Diagnosis not present

## 2024-04-11 DIAGNOSIS — R140 Abdominal distension (gaseous): Secondary | ICD-10-CM | POA: Diagnosis not present

## 2024-04-11 DIAGNOSIS — R1011 Right upper quadrant pain: Secondary | ICD-10-CM | POA: Diagnosis not present

## 2024-04-11 DIAGNOSIS — Z8619 Personal history of other infectious and parasitic diseases: Secondary | ICD-10-CM | POA: Diagnosis not present

## 2024-04-11 DIAGNOSIS — R7982 Elevated C-reactive protein (CRP): Secondary | ICD-10-CM | POA: Diagnosis not present

## 2024-05-07 DIAGNOSIS — Z6841 Body Mass Index (BMI) 40.0 and over, adult: Secondary | ICD-10-CM | POA: Diagnosis not present

## 2024-05-07 DIAGNOSIS — R1011 Right upper quadrant pain: Secondary | ICD-10-CM | POA: Diagnosis not present

## 2024-05-07 DIAGNOSIS — R7982 Elevated C-reactive protein (CRP): Secondary | ICD-10-CM | POA: Diagnosis not present

## 2024-05-07 DIAGNOSIS — E7800 Pure hypercholesterolemia, unspecified: Secondary | ICD-10-CM | POA: Diagnosis not present

## 2024-05-07 DIAGNOSIS — Z8619 Personal history of other infectious and parasitic diseases: Secondary | ICD-10-CM | POA: Diagnosis not present

## 2024-05-07 DIAGNOSIS — E611 Iron deficiency: Secondary | ICD-10-CM | POA: Diagnosis not present

## 2024-05-07 DIAGNOSIS — E66813 Obesity, class 3: Secondary | ICD-10-CM | POA: Diagnosis not present

## 2024-05-07 DIAGNOSIS — E1169 Type 2 diabetes mellitus with other specified complication: Secondary | ICD-10-CM | POA: Diagnosis not present

## 2024-05-07 DIAGNOSIS — R198 Other specified symptoms and signs involving the digestive system and abdomen: Secondary | ICD-10-CM | POA: Diagnosis not present

## 2024-05-16 ENCOUNTER — Other Ambulatory Visit (HOSPITAL_COMMUNITY): Payer: Self-pay

## 2024-05-17 ENCOUNTER — Other Ambulatory Visit (HOSPITAL_COMMUNITY): Payer: Self-pay

## 2024-05-17 MED ORDER — DEXCOM G7 SENSOR MISC
1 refills | Status: DC
  Filled 2024-05-17: qty 3, 30d supply, fill #0
  Filled 2024-06-22: qty 3, 30d supply, fill #1

## 2024-05-21 DIAGNOSIS — E611 Iron deficiency: Secondary | ICD-10-CM | POA: Diagnosis not present

## 2024-05-21 DIAGNOSIS — R7989 Other specified abnormal findings of blood chemistry: Secondary | ICD-10-CM | POA: Diagnosis not present

## 2024-05-21 DIAGNOSIS — E7800 Pure hypercholesterolemia, unspecified: Secondary | ICD-10-CM | POA: Diagnosis not present

## 2024-05-21 DIAGNOSIS — R1011 Right upper quadrant pain: Secondary | ICD-10-CM | POA: Diagnosis not present

## 2024-05-21 DIAGNOSIS — K5900 Constipation, unspecified: Secondary | ICD-10-CM | POA: Diagnosis not present

## 2024-05-21 DIAGNOSIS — R7982 Elevated C-reactive protein (CRP): Secondary | ICD-10-CM | POA: Diagnosis not present

## 2024-05-21 DIAGNOSIS — R198 Other specified symptoms and signs involving the digestive system and abdomen: Secondary | ICD-10-CM | POA: Diagnosis not present

## 2024-05-21 DIAGNOSIS — Z6841 Body Mass Index (BMI) 40.0 and over, adult: Secondary | ICD-10-CM | POA: Diagnosis not present

## 2024-05-21 DIAGNOSIS — E1169 Type 2 diabetes mellitus with other specified complication: Secondary | ICD-10-CM | POA: Diagnosis not present

## 2024-05-21 DIAGNOSIS — E66813 Obesity, class 3: Secondary | ICD-10-CM | POA: Diagnosis not present

## 2024-05-29 ENCOUNTER — Other Ambulatory Visit (HOSPITAL_COMMUNITY): Payer: Self-pay

## 2024-05-29 DIAGNOSIS — E611 Iron deficiency: Secondary | ICD-10-CM | POA: Diagnosis not present

## 2024-05-29 DIAGNOSIS — E66813 Obesity, class 3: Secondary | ICD-10-CM | POA: Diagnosis not present

## 2024-05-29 DIAGNOSIS — E1169 Type 2 diabetes mellitus with other specified complication: Secondary | ICD-10-CM | POA: Diagnosis not present

## 2024-05-29 DIAGNOSIS — E7800 Pure hypercholesterolemia, unspecified: Secondary | ICD-10-CM | POA: Diagnosis not present

## 2024-05-29 DIAGNOSIS — Z6841 Body Mass Index (BMI) 40.0 and over, adult: Secondary | ICD-10-CM | POA: Diagnosis not present

## 2024-05-29 DIAGNOSIS — R198 Other specified symptoms and signs involving the digestive system and abdomen: Secondary | ICD-10-CM | POA: Diagnosis not present

## 2024-05-29 DIAGNOSIS — E559 Vitamin D deficiency, unspecified: Secondary | ICD-10-CM | POA: Diagnosis not present

## 2024-05-29 DIAGNOSIS — E538 Deficiency of other specified B group vitamins: Secondary | ICD-10-CM | POA: Diagnosis not present

## 2024-05-29 MED ORDER — MOUNJARO 2.5 MG/0.5ML ~~LOC~~ SOAJ
2.5000 mg | SUBCUTANEOUS | 0 refills | Status: DC
  Filled 2024-05-29: qty 2, 28d supply, fill #0

## 2024-06-03 ENCOUNTER — Encounter: Payer: Self-pay | Admitting: Physician Assistant

## 2024-06-10 ENCOUNTER — Other Ambulatory Visit (HOSPITAL_COMMUNITY): Payer: Self-pay

## 2024-06-20 ENCOUNTER — Other Ambulatory Visit (HOSPITAL_COMMUNITY): Payer: Self-pay

## 2024-06-20 DIAGNOSIS — D509 Iron deficiency anemia, unspecified: Secondary | ICD-10-CM | POA: Diagnosis not present

## 2024-06-20 DIAGNOSIS — E119 Type 2 diabetes mellitus without complications: Secondary | ICD-10-CM | POA: Diagnosis not present

## 2024-06-20 DIAGNOSIS — I10 Essential (primary) hypertension: Secondary | ICD-10-CM | POA: Diagnosis not present

## 2024-06-20 DIAGNOSIS — R109 Unspecified abdominal pain: Secondary | ICD-10-CM | POA: Diagnosis not present

## 2024-06-20 MED ORDER — FERROUS FUMARATE 324 (106 FE) MG PO TABS
1.0000 | ORAL_TABLET | Freq: Every day | ORAL | 2 refills | Status: DC
  Filled 2024-06-20: qty 30, 30d supply, fill #0

## 2024-06-22 ENCOUNTER — Other Ambulatory Visit (HOSPITAL_COMMUNITY): Payer: Self-pay

## 2024-06-26 ENCOUNTER — Other Ambulatory Visit (HOSPITAL_COMMUNITY): Payer: Self-pay

## 2024-06-26 DIAGNOSIS — E559 Vitamin D deficiency, unspecified: Secondary | ICD-10-CM | POA: Diagnosis not present

## 2024-06-26 DIAGNOSIS — E7800 Pure hypercholesterolemia, unspecified: Secondary | ICD-10-CM | POA: Diagnosis not present

## 2024-06-26 DIAGNOSIS — Z6841 Body Mass Index (BMI) 40.0 and over, adult: Secondary | ICD-10-CM | POA: Diagnosis not present

## 2024-06-26 DIAGNOSIS — E538 Deficiency of other specified B group vitamins: Secondary | ICD-10-CM | POA: Diagnosis not present

## 2024-06-26 DIAGNOSIS — E66813 Obesity, class 3: Secondary | ICD-10-CM | POA: Diagnosis not present

## 2024-06-26 DIAGNOSIS — E1169 Type 2 diabetes mellitus with other specified complication: Secondary | ICD-10-CM | POA: Diagnosis not present

## 2024-06-26 MED ORDER — DEXCOM G7 SENSOR MISC
1 refills | Status: DC
  Filled 2024-06-26 – 2024-07-18 (×2): qty 9, 90d supply, fill #0

## 2024-06-26 MED ORDER — MOUNJARO 2.5 MG/0.5ML ~~LOC~~ SOAJ
2.5000 mg | SUBCUTANEOUS | 2 refills | Status: DC
  Filled 2024-06-26 – 2024-07-18 (×2): qty 2, 28d supply, fill #0
  Filled 2024-08-11: qty 2, 28d supply, fill #1
  Filled 2024-09-06: qty 2, 28d supply, fill #2

## 2024-07-04 ENCOUNTER — Other Ambulatory Visit (HOSPITAL_COMMUNITY): Payer: Self-pay

## 2024-07-04 ENCOUNTER — Other Ambulatory Visit (HOSPITAL_COMMUNITY): Payer: Self-pay | Admitting: Gastroenterology

## 2024-07-04 DIAGNOSIS — R1011 Right upper quadrant pain: Secondary | ICD-10-CM

## 2024-07-04 DIAGNOSIS — R110 Nausea: Secondary | ICD-10-CM | POA: Diagnosis not present

## 2024-07-04 DIAGNOSIS — B9681 Helicobacter pylori [H. pylori] as the cause of diseases classified elsewhere: Secondary | ICD-10-CM | POA: Diagnosis not present

## 2024-07-04 DIAGNOSIS — K219 Gastro-esophageal reflux disease without esophagitis: Secondary | ICD-10-CM | POA: Diagnosis not present

## 2024-07-04 MED ORDER — PANTOPRAZOLE SODIUM 40 MG PO TBEC
40.0000 mg | DELAYED_RELEASE_TABLET | Freq: Every day | ORAL | 4 refills | Status: AC
  Filled 2024-07-04: qty 90, 90d supply, fill #0
  Filled 2024-10-22: qty 90, 90d supply, fill #1

## 2024-07-08 ENCOUNTER — Ambulatory Visit (HOSPITAL_COMMUNITY)
Admission: RE | Admit: 2024-07-08 | Discharge: 2024-07-08 | Disposition: A | Discharge status: Home / Self Care | Source: Ambulatory Visit | Attending: Gastroenterology | Admitting: Gastroenterology

## 2024-07-08 DIAGNOSIS — R1011 Right upper quadrant pain: Secondary | ICD-10-CM

## 2024-07-08 DIAGNOSIS — B9681 Helicobacter pylori [H. pylori] as the cause of diseases classified elsewhere: Secondary | ICD-10-CM | POA: Diagnosis not present

## 2024-07-08 DIAGNOSIS — R110 Nausea: Secondary | ICD-10-CM | POA: Diagnosis not present

## 2024-07-08 DIAGNOSIS — K219 Gastro-esophageal reflux disease without esophagitis: Secondary | ICD-10-CM | POA: Diagnosis not present

## 2024-07-08 MED ORDER — TECHNETIUM TC 99M MEBROFENIN IV KIT
5.1800 | PACK | Freq: Once | INTRAVENOUS | Status: AC
  Administered 2024-07-08: 5.18 via INTRAVENOUS

## 2024-07-18 ENCOUNTER — Other Ambulatory Visit (HOSPITAL_COMMUNITY): Payer: Self-pay

## 2024-07-25 ENCOUNTER — Ambulatory Visit: Admitting: Physician Assistant

## 2024-08-16 ENCOUNTER — Other Ambulatory Visit (HOSPITAL_COMMUNITY): Payer: Self-pay

## 2024-09-14 ENCOUNTER — Other Ambulatory Visit (HOSPITAL_COMMUNITY): Payer: Self-pay

## 2024-09-27 ENCOUNTER — Encounter: Payer: Self-pay | Admitting: Family Medicine

## 2024-09-27 ENCOUNTER — Ambulatory Visit: Admitting: Family Medicine

## 2024-09-27 VITALS — BP 128/86 | HR 96 | Ht 66.0 in | Wt 260.4 lb

## 2024-09-27 DIAGNOSIS — Z7985 Long-term (current) use of injectable non-insulin antidiabetic drugs: Secondary | ICD-10-CM

## 2024-09-27 DIAGNOSIS — E1169 Type 2 diabetes mellitus with other specified complication: Secondary | ICD-10-CM | POA: Diagnosis not present

## 2024-09-27 DIAGNOSIS — R1011 Right upper quadrant pain: Secondary | ICD-10-CM | POA: Diagnosis not present

## 2024-09-27 DIAGNOSIS — E66813 Obesity, class 3: Secondary | ICD-10-CM

## 2024-09-27 DIAGNOSIS — K760 Fatty (change of) liver, not elsewhere classified: Secondary | ICD-10-CM | POA: Diagnosis not present

## 2024-09-28 ENCOUNTER — Encounter: Payer: Self-pay | Admitting: Family Medicine

## 2024-09-28 ENCOUNTER — Other Ambulatory Visit (HOSPITAL_COMMUNITY): Payer: Self-pay

## 2024-09-28 DIAGNOSIS — K219 Gastro-esophageal reflux disease without esophagitis: Secondary | ICD-10-CM | POA: Insufficient documentation

## 2024-09-28 DIAGNOSIS — E1169 Type 2 diabetes mellitus with other specified complication: Secondary | ICD-10-CM | POA: Insufficient documentation

## 2024-09-28 DIAGNOSIS — E119 Type 2 diabetes mellitus without complications: Secondary | ICD-10-CM | POA: Insufficient documentation

## 2024-09-28 DIAGNOSIS — E538 Deficiency of other specified B group vitamins: Secondary | ICD-10-CM | POA: Insufficient documentation

## 2024-09-28 DIAGNOSIS — R1011 Right upper quadrant pain: Secondary | ICD-10-CM | POA: Insufficient documentation

## 2024-09-28 DIAGNOSIS — R7982 Elevated C-reactive protein (CRP): Secondary | ICD-10-CM | POA: Insufficient documentation

## 2024-09-28 DIAGNOSIS — K760 Fatty (change of) liver, not elsewhere classified: Secondary | ICD-10-CM | POA: Insufficient documentation

## 2024-09-28 DIAGNOSIS — Z6841 Body Mass Index (BMI) 40.0 and over, adult: Secondary | ICD-10-CM | POA: Insufficient documentation

## 2024-09-28 DIAGNOSIS — E559 Vitamin D deficiency, unspecified: Secondary | ICD-10-CM | POA: Insufficient documentation

## 2024-09-28 MED ORDER — DEXCOM G7 SENSOR MISC
1 refills | Status: AC
  Filled 2024-09-28: qty 3, 30d supply, fill #0
  Filled 2024-10-22: qty 9, 90d supply, fill #0

## 2024-09-28 MED ORDER — BLOOD GLUCOSE TEST VI STRP
ORAL_STRIP | 3 refills | Status: AC
  Filled 2024-09-28 – 2024-10-22 (×2): qty 100, 50d supply, fill #0

## 2024-09-28 MED ORDER — TIRZEPATIDE 5 MG/0.5ML ~~LOC~~ SOAJ
5.0000 mg | SUBCUTANEOUS | 1 refills | Status: AC
  Filled 2024-09-28: qty 6, 84d supply, fill #0
  Filled 2024-09-30 – 2024-10-18 (×2): qty 2, 28d supply, fill #0
  Filled 2024-11-14: qty 2, 28d supply, fill #1

## 2024-09-28 NOTE — Addendum Note (Signed)
 Addended by: PRENTISS FRIEZE R on: 09/28/2024 03:31 PM   Modules accepted: Orders

## 2024-09-28 NOTE — Progress Notes (Signed)
 Diagnoses and Orders:   1. Type 2 diabetes mellitus, controlled with low dose Mounjaro , original A1c 14, most recent 6.8   2. Vitamin D deficiency, at goal, taking daily supplement   3. Hyperlipidemia associated with type 2 diabetes mellitus (HCC)   4. B12 deficiency   5. Right upper quadrant abdominal pain, Hx of RUQ US  and HIDA, Dr. Kristie   6. Hepatic steatosis, US  07/08/24   7. Elevated C-reactive protein (CRP)   8. Gastroesophageal reflux disease, unspecified whether esophagitis present   9. Class 3 severe obesity, high weight 260, goal: 1200-1500 cal/day, protein 95g/day    Meds ordered this encounter  Medications   Continuous Glucose Sensor (DEXCOM G7 SENSOR) MISC    Sig: Use one sensor to transmit blood sugar continuously every 10 days.    Dispense:  9 each    Refill:  1   Glucose Blood (BLOOD GLUCOSE TEST STRIPS) STRP    Sig: Use to check blood sugar via finger stick twice daily.    Dispense:  100 strip    Refill:  3   Assessment & Plan:   Assessment and Plan Assessment & Plan Type 2 diabetes mellitus Managed with Mounjaro . Considering dosage adjustment to 2.5 mg every five days for weight loss and glycemic control. Insurance coverage uncertain. Using Dexcom for glucose monitoring. - Continue Mounjaro  with possible adjustment to 2.5 mg every five days if insurance allows. - Continue using Dexcom for glucose monitoring.  Fatty liver disease Mounjaro  may aid in management. Started Berberine with milk thistle for additional support. - Continue Mounjaro  for management of fatty liver disease. - Continue Berberine with milk thistle.  Abdominal pain Symptoms improving with decreased CRP levels.  General Health Maintenance Updating vaccination records. Focusing on hydration and protein intake. - Bring vaccination records to next appointment for updating. - Continue maintaining hydration and protein intake.  Geni Shutter, DO, MS, FAAFP, Dipl. [facility] [address] [place], [address] Dept: [phone] Dept Fax: [phone]  Subjective:   History of Present Illness Heather Gregory is a 38 year old female with a history of fatty liver who presents for follow-up on her liver condition and medication management.  Hepatic steatosis - Fatty liver identified on US . - Uses Berberine with milk thistle for hepatic support. - No current symptoms related to liver dysfunction reported.  Glucose management - On Mounjaro  2.5 mg for glycemic control - she does not tolerate the  mg dose well, but would like to try 2.5 mg q 5 days. - Uses Dexcom continuous glucose monitor without issues.  Iron status and inflammatory markers - Concern regarding iron levels. - Previously elevated CRP, currently decreasing.  Diet and physical activity - Minimal recent exercise. - Consumes one to two meals per day. - Uses Fairlife protein drinks for protein supplementation. - Drinks water regularly for hydration.  Review of Systems: Negative, with the exception of above mentioned in HPI. Current Medications[1]   Objective:   BP 128/86 (BP Location: Right Arm, Cuff Size: Large)   Pulse 96   Ht 5' 6 (1.676 m)   Wt 260 lb 6.4 oz (118.1 kg)   SpO2 98%   BMI 42.03 kg/m   Physical Exam Constitutional:      General: She is not in acute distress.    Appearance: She is well-developed.  HENT:     Head: Normocephalic and atraumatic.  Eyes:     Conjunctiva/sclera: Conjunctivae normal.  Cardiovascular:  Rate and Rhythm: Normal rate and regular rhythm.     Heart sounds: Normal heart sounds.  Pulmonary:     Effort: Pulmonary effort is normal.     Breath sounds: Normal breath sounds.  Neurological:     General: No focal deficit present.     Mental Status: She is alert.  Psychiatric:        Behavior: Behavior normal.    Attestations:   Patient is well-known to me from previous care setting and is establishing  care in this system with me as PCP., Available records reviewed., Chart updated today with reconciliation of problem list, medications, allergies, and relevant history. Preventive care and chronic disease status reviewed. , Portions of historical chart may remain incomplete; will update on an ongoing basis as clinically indicated. , and Discussed the use of AI scribe software for clinical note transcription with the patient, who gave verbal consent to proceed.  I personally spent a total of 46 minutes in the care of the patient today including preparing to see the patient, performing a medically appropriate exam/evaluation, counseling and educating, placing orders, and documenting clinical information in the EHR.     [1]  Current Outpatient Medications:    cetirizine (ZYRTEC) 5 MG tablet, Take 5 mg by mouth 2 (two) times daily., Disp: , Rfl:    cholecalciferol (VITAMIN D) 1000 UNITS tablet, Take 5,000 Units by mouth daily., Disp: , Rfl:    pantoprazole  (PROTONIX ) 40 MG tablet, Take 1 tablet (40 mg total) by mouth daily 1/2 to 1 hour before morning meal, Disp: 90 tablet, Rfl: 4   Probiotic Product (SOLUBLE FIBER/PROBIOTICS PO), Take by mouth 2 (two) times daily. , Disp: , Rfl:    tirzepatide  (MOUNJARO ) 2.5 MG/0.5ML Pen, Inject 2.5 mg into the skin once a week., Disp: 2 mL, Rfl: 2   triamcinolone  ointment (KENALOG ) 0.1 %, Apply 1 application externally 2 times daily, Disp: 454 g, Rfl: 3   Continuous Glucose Sensor (DEXCOM G7 SENSOR) MISC, Use one sensor to transmit blood sugar continuously every 10 days., Disp: 9 each, Rfl: 1   FeFum-FePoly-FA-B Cmp-C-Biot (IRON FOLATE PLUS) CAPS, Take 1 tablet by mouth daily at 12 noon., Disp: , Rfl:    Glucose Blood (BLOOD GLUCOSE TEST STRIPS) STRP, Use to check blood sugar via finger stick twice daily., Disp: 100 strip, Rfl: 3   Multiple Vitamins-Minerals (ALIVE ONCE DAILY WOMENS) TABS, Take 1 tablet by mouth daily., Disp: , Rfl:    Probiotic Product (PROBIOTIC  BLEND PO), Take 1 capsule by mouth daily at 12 noon., Disp: , Rfl:

## 2024-09-29 ENCOUNTER — Other Ambulatory Visit (HOSPITAL_COMMUNITY): Payer: Self-pay

## 2024-09-30 ENCOUNTER — Other Ambulatory Visit: Payer: Self-pay

## 2024-09-30 ENCOUNTER — Other Ambulatory Visit (HOSPITAL_COMMUNITY): Payer: Self-pay

## 2024-10-01 ENCOUNTER — Other Ambulatory Visit: Payer: Self-pay

## 2024-10-01 ENCOUNTER — Other Ambulatory Visit (HOSPITAL_COMMUNITY): Payer: Self-pay

## 2024-10-09 ENCOUNTER — Other Ambulatory Visit (HOSPITAL_COMMUNITY): Payer: Self-pay

## 2024-10-11 ENCOUNTER — Other Ambulatory Visit (HOSPITAL_COMMUNITY): Payer: Self-pay

## 2024-10-18 ENCOUNTER — Other Ambulatory Visit (HOSPITAL_COMMUNITY): Payer: Self-pay

## 2024-10-22 ENCOUNTER — Other Ambulatory Visit (HOSPITAL_COMMUNITY): Payer: Self-pay

## 2024-10-24 ENCOUNTER — Other Ambulatory Visit (HOSPITAL_COMMUNITY): Payer: Self-pay

## 2024-11-01 ENCOUNTER — Ambulatory Visit: Admitting: Family Medicine

## 2024-11-15 ENCOUNTER — Other Ambulatory Visit (HOSPITAL_COMMUNITY): Payer: Self-pay

## 2025-01-24 ENCOUNTER — Ambulatory Visit: Admitting: Family Medicine
